# Patient Record
Sex: Male | Born: 1995 | Race: Black or African American | Hispanic: No | Marital: Single | Smoking: Never smoker
Health system: Southern US, Community
[De-identification: ages and names within clinical notes are randomized; demographics above are authoritative.]

## PROBLEM LIST (undated history)

## (undated) DIAGNOSIS — H5350 Unspecified color vision deficiencies: Secondary | ICD-10-CM

## (undated) DIAGNOSIS — J302 Other seasonal allergic rhinitis: Principal | ICD-10-CM

## (undated) DIAGNOSIS — L409 Psoriasis, unspecified: Secondary | ICD-10-CM

## (undated) HISTORY — DX: Psoriasis, unspecified: L40.9

## (undated) HISTORY — DX: Other seasonal allergic rhinitis: J30.2

## (undated) HISTORY — DX: Unspecified color vision deficiencies: H53.50

---
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
## 2017-07-20 ENCOUNTER — Encounter: Payer: Self-pay | Admitting: Family Medicine

## 2017-07-20 ENCOUNTER — Ambulatory Visit (INDEPENDENT_AMBULATORY_CARE_PROVIDER_SITE_OTHER): Payer: BLUE CROSS/BLUE SHIELD | Admitting: Family Medicine

## 2017-07-20 VITALS — BP 153/91 | HR 76 | Temp 97.7°F | Ht 75.0 in | Wt 242.0 lb

## 2017-07-20 DIAGNOSIS — R030 Elevated blood-pressure reading, without diagnosis of hypertension: Secondary | ICD-10-CM | POA: Diagnosis not present

## 2017-07-20 DIAGNOSIS — J302 Other seasonal allergic rhinitis: Secondary | ICD-10-CM | POA: Diagnosis not present

## 2017-07-20 DIAGNOSIS — H1013 Acute atopic conjunctivitis, bilateral: Secondary | ICD-10-CM

## 2017-07-20 DIAGNOSIS — L409 Psoriasis, unspecified: Secondary | ICD-10-CM

## 2017-07-20 HISTORY — DX: Psoriasis, unspecified: L40.9

## 2017-07-20 MED ORDER — FLUTICASONE PROPIONATE 50 MCG/ACT NA SUSP: 2.0000 | Freq: Every day | NASAL | 2 refills | Status: DC

## 2017-07-20 MED ORDER — OLOPATADINE HCL 0.1 % OP SOLN: 1.0000 [drp] | Freq: Two times a day (BID) | OPHTHALMIC | 2 refills | Status: DC

## 2017-07-20 MED ORDER — MONTELUKAST SODIUM 10 MG PO TABS: 10.0000 mg | ORAL_TABLET | Freq: Every day | ORAL | 3 refills | Status: DC

## 2017-07-20 NOTE — Progress Notes (Signed)
Derryck Shahan is a 22 y.o. male who presents to [facility]: Primary Care Sports Medicine today for seasonal allergies.  Alveda Reasons has a history of seasonal allergies.  This is been previously pretty well controlled with levocetirizine.  However he notes the last several weeks significant nasal congestion itchy watery eyes and nasal discharge and sneezing.  He notes the levocetirizine is not been very helpful.  He denies any fevers or chills nausea vomiting or diarrhea.  He notes the symptoms are consistent with prior history of seasonal allergies.  Additionally he notes a history of mildly elevated blood pressure on prior checks.  He notes a family history for hypertension.  He is a Land at [employer].  He does not take any medications for blood pressure.  He denies chest pain palpitations or shortness of breath.   Past Medical History:  Diagnosis Date  . Color blind   . Psoriasis 07/20/2017  . Seasonal allergies 07/27/2015   No past surgical history on file. Social History   Tobacco Use  . Smoking status: Never Smoker  . Smokeless tobacco: Never Used  Substance Use Topics  . Alcohol use: No   family history is not on file.  ROS as above:  Medications: Current Outpatient Medications  Medication Sig Dispense Refill  . clobetasol (TEMOVATE) 0.05 % external solution Apply twice a day as needed for psoriasis involvement in the scalp and ears until smooth    . clobetasol ointment (TEMOVATE) 0.05 % Apply BID to the areas of psoriasis involvement until smooth    . Clobetasol Propionate 0.05 % shampoo Apply topically.    . tretinoin (RETIN-A) 0.025 % cream Apply a pea size to dry face nightly    . fluticasone (FLONASE) 50 MCG/ACT nasal spray Place 2 sprays into both nostrils daily. 16 g 2  . montelukast (SINGULAIR) 10 MG tablet Take 1 tablet (10 mg total) by mouth at bedtime. 90 tablet 3  .  olopatadine (PATANOL) 0.1 % ophthalmic solution Place 1 drop into both eyes 2 (two) times daily. 5 mL 2   No current facility-administered medications for this visit.    No Known Allergies  Health Maintenance Health Maintenance  Topic Date Due  . INFLUENZA VACCINE  10/12/2017  . TETANUS/TDAP  12/04/2018  . HIV Screening  Completed     Exam:  BP (!) 153/91   Pulse 76   Temp 97.7 F (36.5 C) (Oral)   Ht  (1.905 m)   Wt 242 lb (109.8 kg)   BMI 30.25 kg/m  Gen: Well NAD HEENT: EOMI,  MMM clear nasal discharge.  Inflamed nasal turbinates bilaterally.  Mild conjunctival injection bilaterally.  Normal tympanic membranes.  No cervical lymphadenopathy. Lungs: Normal work of breathing. CTABL Heart: RRR no MRG Abd: NABS, Soft. Nondistended, Nontender Exts: Brisk capillary refill, warm and well perfused.  Skin: Scaly circular macular lesions on scalp some hypopigmented.    Assessment and Plan: 22 y.o. male with seasonal allergies.  Allergic rhinitis and conjunctivitis.  Treat with oral nonsedating antihistamine.  Recommend cetirizine.  We will add Singulair and Flonase nasal spray.  Additionally use Patanol eyedrops.  Skin lesions are psoriasis which is currently well managed with medications updated today.    Follow-up with PCP in a few weeks for hypertension evaluation and management.  Recheck as needed.   No orders of the defined types were placed in this encounter.  Meds ordered this encounter  Medications  . olopatadine (  PATANOL) 0.1 % ophthalmic solution    Sig: Place 1 drop into both eyes 2 (two) times daily.    Dispense:  5 mL    Refill:  2  . montelukast (SINGULAIR) 10 MG tablet    Sig: Take 1 tablet (10 mg total) by mouth at bedtime.    Dispense:  90 tablet    Refill:  3  . fluticasone (FLONASE) 50 MCG/ACT nasal spray    Sig: Place 2 sprays into both nostrils daily.    Dispense:  16 g    Refill:  2     Discussed warning signs or symptoms. Please see  discharge instructions. Patient expresses understanding.

## 2017-07-20 NOTE — Patient Instructions (Addendum)
Thank you for coming in today. Take zyrtec or claratin or allegra daily or twice daily.  Use the flonase nasal spray for at least 1 month.  Start singulair daily for allergies.  Use the eye drops as needed.  If not better let me know.  We can get your to an allergies  Blood pressure keep a log at school.  If persistently elevated let us know.  .  I recommend follow up with Dr T before you go back to school.   Allergic Rhinitis, Adult Allergic rhinitis is an allergic reaction that affects the mucous membrane inside the nose. It causes sneezing, a runny or stuffy nose, and the feeling of mucus going down the back of the throat (postnasal drip). Allergic rhinitis can be mild to severe. There are two types of allergic rhinitis:  Seasonal. This type is also called hay fever. It happens only during certain seasons.  Perennial. This type can happen at any time of the year.  What are the causes? This condition happens when the body's defense system (immune system) responds to certain harmless substances called allergens as though they were germs.  Seasonal allergic rhinitis is triggered by pollen, which can come from grasses, trees, and weeds. Perennial allergic rhinitis may be caused by:  House dust mites.  Pet dander.  Mold spores.  What are the signs or symptoms? Symptoms of this condition include:  Sneezing.  Runny or stuffy nose (nasal congestion).  Postnasal drip.  Itchy nose.  Tearing of the eyes.  Trouble sleeping.  Daytime sleepiness.  How is this diagnosed? This condition may be diagnosed based on:  Your medical history.  A physical exam.  Tests to check for related conditions, such as: ? Asthma. ? Pink eye. ? Ear infection. ? Upper respiratory infection.  Tests to find out which allergens trigger your symptoms. These may include skin or blood tests.  How is this treated? There is no cure for this condition, but treatment can help control symptoms.  Treatment may include:  Taking medicines that block allergy symptoms, such as antihistamines. Medicine may be given as a shot, nasal spray, or pill.  Avoiding the allergen.  Desensitization. This treatment involves getting ongoing shots until your body becomes less sensitive to the allergen. This treatment may be done if other treatments do not help.  If taking medicine and avoiding the allergen does not work, new, stronger medicines may be prescribed.  Follow these instructions at home:  Find out what you are allergic to. Common allergens include smoke, dust, and pollen.  Avoid the things you are allergic to. These are some things you can do to help avoid allergens: ? Replace carpet with wood, tile, or vinyl flooring. Carpet can trap dander and dust. ? Do not smoke. Do not allow smoking in your home. ? Change your heating and air conditioning filter at least once a month. ? During allergy season:  Keep windows closed as much as possible.  Plan outdoor activities when pollen counts are lowest. This is usually during the evening hours.  When coming indoors, change clothing and shower before sitting on furniture or bedding.  Take over-the-counter and prescription medicines only as told by your health care provider.  Keep all follow-up visits as told by your health care provider. This is important. Contact a health care provider if:  You have a fever.  You develop a persistent cough.  You make whistling sounds when you breathe (you wheeze).  Your symptoms interfere with your normal  daily activities. Get help right away if:  You have shortness of breath. Summary  This condition can be managed by taking medicines as directed and avoiding allergens.  Contact your health care provider if you develop a persistent cough or fever.  During allergy season, keep windows closed as much as possible. This information is not intended to replace advice given to you by your health care  provider. Make sure you discuss any questions you have with your health care provider. Document Released: 11/23/2000 Document Revised: 04/07/2016 Document Reviewed: 04/07/2016 Elsevier Interactive Patient Education  [employer].

## 2017-09-18 IMAGING — CR DG CHEST 2V
2 series · 2 of 2 positions shown · non-contrast
Comparison: No prior

CLINICAL DATA: Cough for 3 days.

EXAM:
CHEST  2 VIEW

[chest pa]
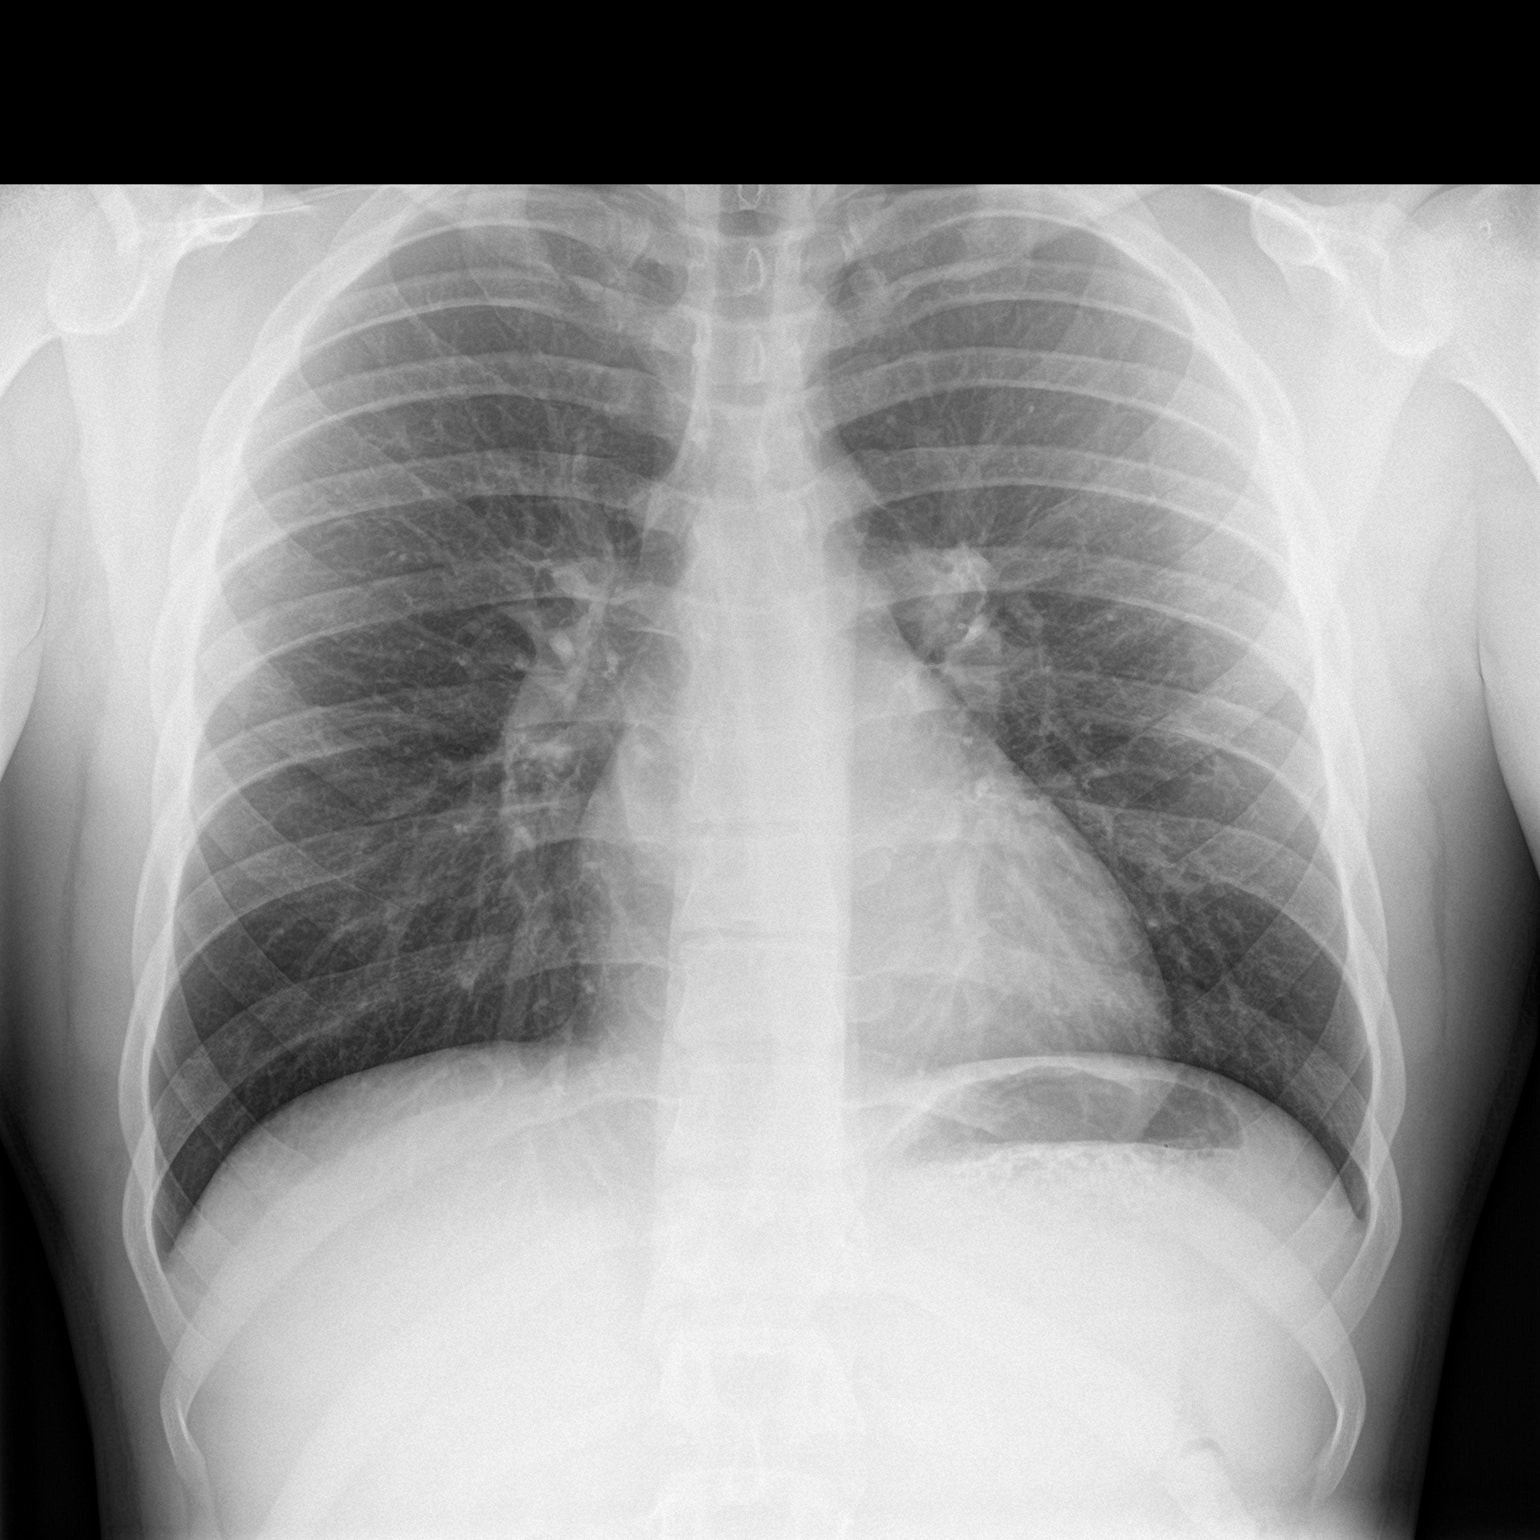

[chest lat]
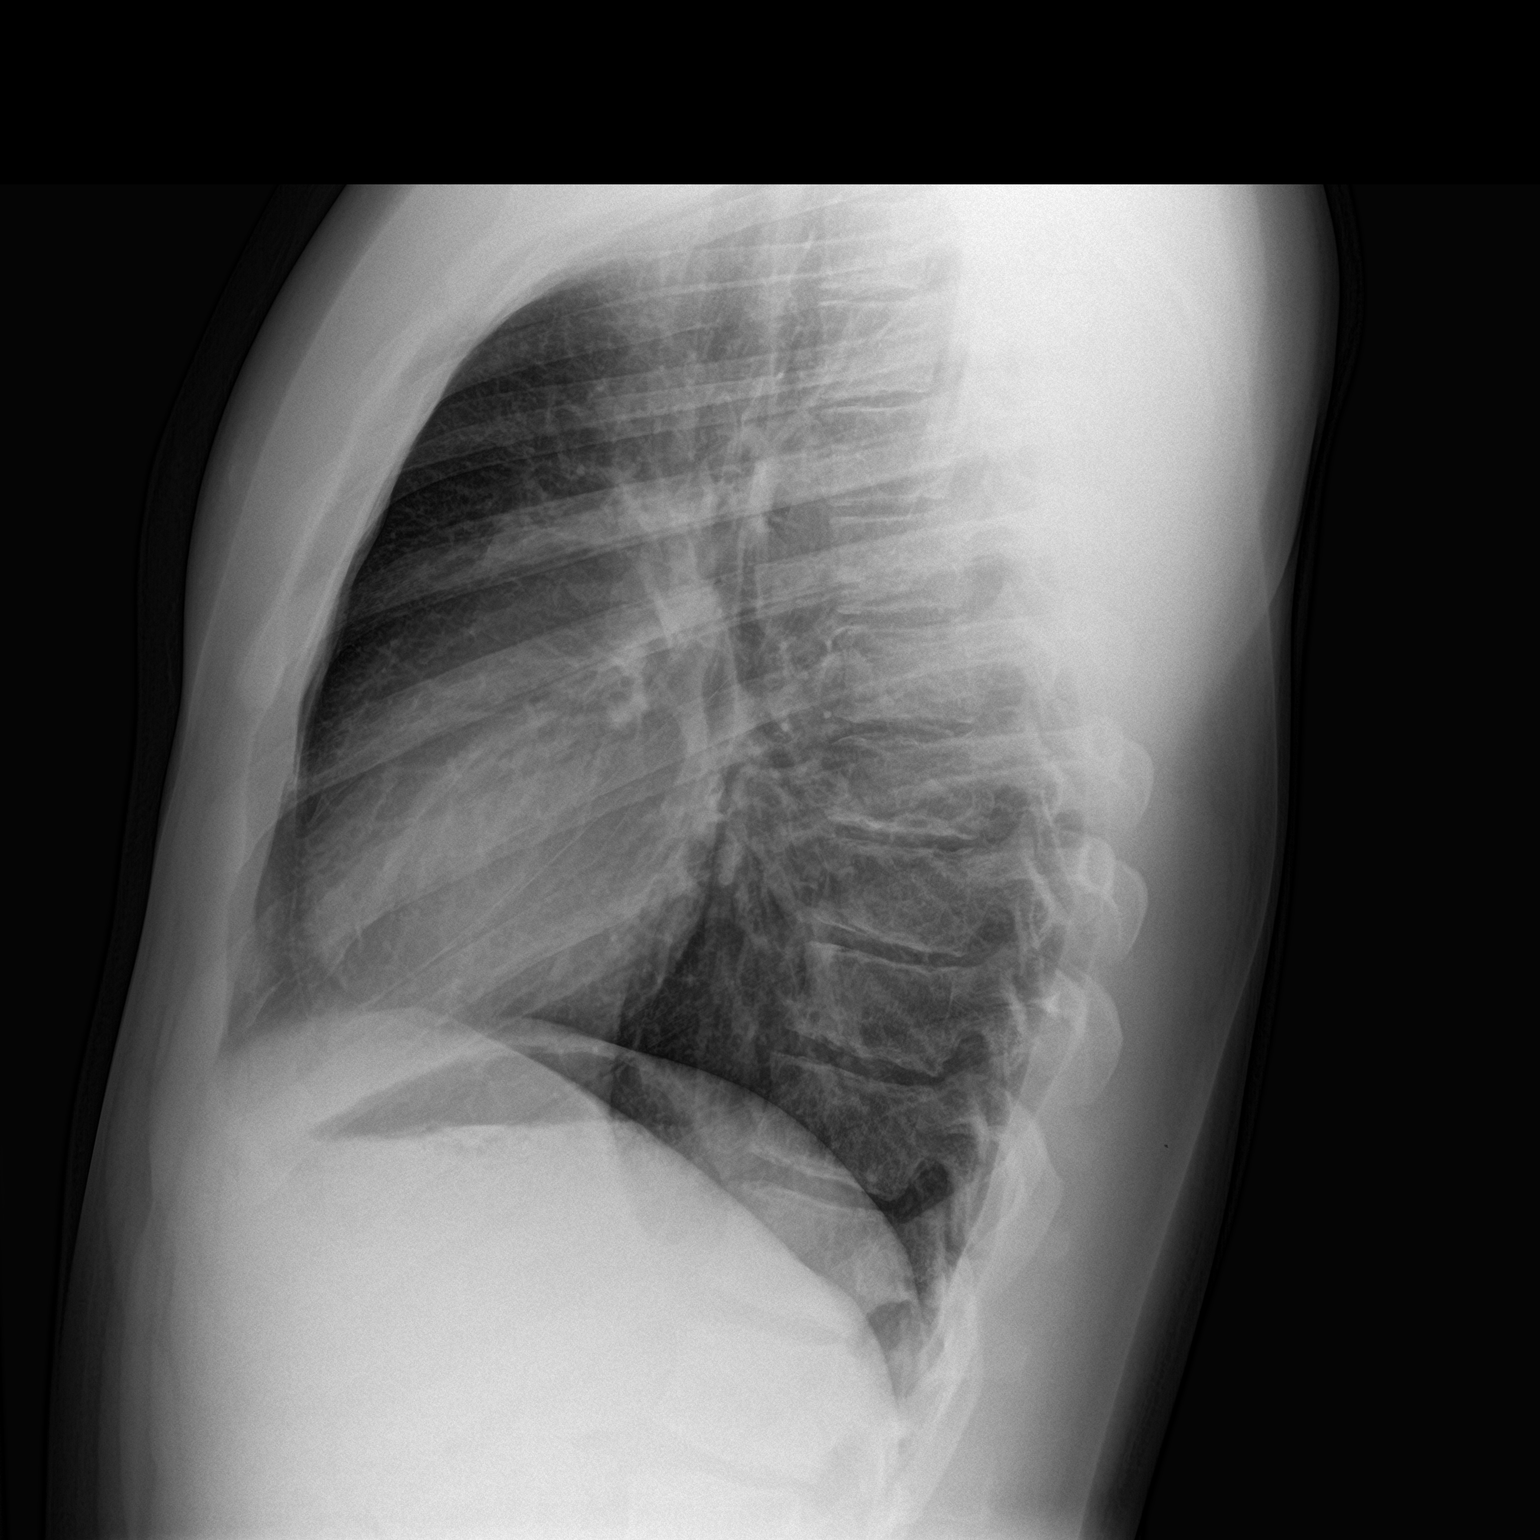

[2 of 2 positions shown; findings below may reference images not displayed]

FINDINGS: Mediastinum and hilar structures normal. Lungs are clear. Heart size
normal. No pleural effusion or pneumothorax . Mild pectus deformity.
IMPRESSION: No acute cardiopulmonary disease.

## 2018-07-17 ENCOUNTER — Other Ambulatory Visit: Payer: Self-pay | Admitting: Sports Medicine

## 2018-08-08 ENCOUNTER — Ambulatory Visit (INDEPENDENT_AMBULATORY_CARE_PROVIDER_SITE_OTHER): Payer: BC Managed Care – PPO | Admitting: Sports Medicine

## 2018-08-08 ENCOUNTER — Encounter: Payer: Self-pay | Admitting: Sports Medicine

## 2018-08-08 DIAGNOSIS — R030 Elevated blood-pressure reading, without diagnosis of hypertension: Secondary | ICD-10-CM

## 2018-08-08 DIAGNOSIS — N489 Disorder of penis, unspecified: Secondary | ICD-10-CM | POA: Diagnosis not present

## 2018-08-08 DIAGNOSIS — Z113 Encounter for screening for infections with a predominantly sexual mode of transmission: Secondary | ICD-10-CM | POA: Diagnosis not present

## 2018-08-08 NOTE — Assessment & Plan Note (Signed)
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 I offered cryotherapy, he will think about it. At some point at a future visit we do need to discuss HPV vaccination.

## 2018-08-08 NOTE — Progress Notes (Signed)
Subjective:    CC: Reestablish care  HPI:  This is a pleasant 23 year old male, he is here to discuss STD screening.  He has no symptoms with the exception of a few bumps on the shaft of his penis.  No dysuria, no definite exposures to STDs.  In addition he is due for some routine screening labs.  I have not seen him in some time.  I reviewed the past medical history, family history, social history, surgical history, and allergies today and no changes were needed.  Please see the problem list section below in epic for further details.  Past Medical History: Past Medical History:  Diagnosis Date  . Color blind   . Psoriasis 07/20/2017  . Seasonal allergies 07/27/2015   Past Surgical History: No past surgical history on file. Social History: Social History   Socioeconomic History  . Marital status: Single    Spouse name: Not on file  . Number of children: Not on file  . Years of education: Not on file  . Highest education level: Not on file  Occupational History  . Not on file  Social Needs  . Financial resource strain: Not on file  . Food insecurity:    Worry: Not on file    Inability: Not on file  . Transportation needs:    Medical: Not on file    Non-medical: Not on file  Tobacco Use  . Smoking status: Never Smoker  . Smokeless tobacco: Never Used  Substance and Sexual Activity  . Alcohol use: No  . Drug use: No  . Sexual activity: Never  Lifestyle  . Physical activity:    Days per week: Not on file    Minutes per session: Not on file  . Stress: Not on file  Relationships  . Social connections:    Talks on phone: Not on file    Gets together: Not on file    Attends religious service: Not on file    Active member of club or organization: Not on file    Attends meetings of clubs or organizations: Not on file    Relationship status: Not on file  Other Topics Concern  . Not on file  Social History Narrative  . Not on file   Family History: Family History   Problem Relation Age of Onset  . Hypertension Father   . Diabetes Paternal Grandmother    Allergies: No Known Allergies Medications: See med rec.  Review of Systems: No headache, visual changes, nausea, vomiting, diarrhea, constipation, dizziness, abdominal pain, skin rash, fevers, chills, night sweats, swollen lymph nodes, weight loss, chest pain, body aches, joint swelling, muscle aches, shortness of breath, mood changes, visual or auditory hallucinations.  Objective:    General: Well Developed, well nourished, and in no acute distress.  Neuro: Alert and oriented x3, extra-ocular muscles intact, sensation grossly intact.  HEENT: Normocephalic, atraumatic, pupils equal round reactive to light, neck supple, no masses, no lymphadenopathy, thyroid nonpalpable.  Skin: Warm and dry, no rashes noted.  Cardiac: Regular rate and rhythm, no murmurs rubs or gallops.  Respiratory: Clear to auscultation bilaterally. Not using accessory muscles, speaking in full sentences.  Abdominal: Soft, nontender, nondistended, positive bowel sounds, no masses, no organomegaly.  Musculoskeletal: Shoulder, elbow, wrist, hip, knee, ankle stable, and with full range of motion. Genitourinary: There are a few papules on the shaft of the penis that resemble genital warts, no discharge, no other abnormalities visible.  Impression and Recommendations:    The patient was counselled,  risk factors were discussed, anticipatory guidance given.  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 I offered cryotherapy, he will think about it. At some point at a future visit we do need to discuss HPV vaccination.  Screening examination for STD (sexually transmitted disease) Doing the full STD screens.   ___________________________________________ Ihor Austinhomas J. Benjamin Stainhekkekandam, M.D., ABFM., CAQSM. Primary Care and Sports Medicine [facility]  Adjunct Professor of Family Medicine  [employer]

## 2018-08-08 NOTE — Assessment & Plan Note (Signed)
Doing the full STD screens.

## 2018-08-09 LAB — LIPID PANEL W/REFLEX DIRECT LDL
Cholesterol: 141 mg/dL (ref <200)
HDL: 50 mg/dL (ref >=40)
LDL Cholesterol (Calc): 78 mg/dL (calc)
Non-HDL Cholesterol (Calc): 91 mg/dL (calc) (ref <130)
Total CHOL/HDL Ratio: 2.8 (calc) (ref <5.0)
Triglycerides: 45 mg/dL (ref <150)

## 2018-08-09 LAB — COMPLETE METABOLIC PANEL WITH GFR
AG Ratio: 1.6 (calc) (ref 1.0–2.5)
ALT: 13 U/L (ref 9–46)
AST: 14 U/L (ref 10–40)
Albumin: 4.8 g/dL (ref 3.6–5.1)
Alkaline phosphatase (APISO): 70 U/L (ref 36–130)
BUN: 16 mg/dL (ref 7–25)
CO2: 29 mmol/L (ref 20–32)
Calcium: 10.2 mg/dL (ref 8.6–10.3)
Chloride: 100 mmol/L (ref 98–110)
Creat: 1.01 mg/dL (ref 0.60–1.35)
GFR, Est African American: 123 mL/min/{1.73_m2} (ref >=60)
GFR, Est Non African American: 106 mL/min/{1.73_m2} (ref >=60)
Globulin: 3 g/dL (calc) (ref 1.9–3.7)
Glucose, Bld: 94 mg/dL (ref 65–99)
Potassium: 4.8 mmol/L (ref 3.5–5.3)
Sodium: 138 mmol/L (ref 135–146)
Total Bilirubin: 0.5 mg/dL (ref 0.2–1.2)
Total Protein: 7.8 g/dL (ref 6.1–8.1)

## 2018-08-09 LAB — CBC
HCT: 50.4 % — ABNORMAL HIGH (ref 38.5–50.0)
Hemoglobin: 17.4 g/dL — ABNORMAL HIGH (ref 13.2–17.1)
MCH: 31.9 pg (ref 27.0–33.0)
MCHC: 34.5 g/dL (ref 32.0–36.0)
MCV: 92.5 fL (ref 80.0–100.0)
MPV: 10.5 fL (ref 7.5–12.5)
Platelets: 230 10*3/uL (ref 140–400)
RBC: 5.45 10*6/uL (ref 4.20–5.80)
RDW: 11.7 % (ref 11.0–15.0)
WBC: 6 10*3/uL (ref 3.8–10.8)

## 2018-08-09 LAB — TSH: TSH: 0.74 mIU/L (ref 0.40–4.50)

## 2018-08-09 LAB — HEPATITIS PANEL, ACUTE
Hep A IgM: NONREACTIVE
Hep B C IgM: NONREACTIVE
Hepatitis B Surface Ag: NONREACTIVE
Hepatitis C Ab: NONREACTIVE
SIGNAL TO CUT-OFF: 0.04 (ref <1.00)

## 2018-08-09 LAB — C. TRACHOMATIS/N. GONORRHOEAE RNA
C. trachomatis RNA, TMA: NOT DETECTED
N. gonorrhoeae RNA, TMA: NOT DETECTED

## 2018-08-09 LAB — HEMOGLOBIN A1C
Hgb A1c MFr Bld: 5 % of total Hgb (ref <5.7)
Mean Plasma Glucose: 97 (calc)
eAG (mmol/L): 5.4 (calc)

## 2018-08-09 LAB — HIV ANTIBODY (ROUTINE TESTING W REFLEX): HIV 1&2 Ab, 4th Generation: NONREACTIVE

## 2018-08-09 LAB — RPR: RPR Ser Ql: NONREACTIVE

## 2018-08-09 LAB — VITAMIN D 25 HYDROXY (VIT D DEFICIENCY, FRACTURES): Vit D, 25-Hydroxy: 32 ng/mL (ref 30–100)

## 2018-08-09 LAB — HSV 2 ANTIBODY, IGG: HSV 2 Glycoprotein G Ab, IgG: <0.9 index

## 2019-03-29 ENCOUNTER — Encounter: Payer: BC Managed Care – PPO | Admitting: Sports Medicine

## 2019-04-01 ENCOUNTER — Ambulatory Visit (INDEPENDENT_AMBULATORY_CARE_PROVIDER_SITE_OTHER): Payer: BC Managed Care – PPO | Admitting: Sports Medicine

## 2019-04-01 ENCOUNTER — Other Ambulatory Visit: Payer: Self-pay

## 2019-04-01 ENCOUNTER — Encounter: Payer: Self-pay | Admitting: Sports Medicine

## 2019-04-01 DIAGNOSIS — L409 Psoriasis, unspecified: Secondary | ICD-10-CM

## 2019-04-01 DIAGNOSIS — H6123 Impacted cerumen, bilateral: Secondary | ICD-10-CM | POA: Diagnosis not present

## 2019-04-01 DIAGNOSIS — Z0000 Encounter for general adult medical examination without abnormal findings: Secondary | ICD-10-CM | POA: Insufficient documentation

## 2019-04-01 DIAGNOSIS — Z025 Encounter for examination for participation in sport: Secondary | ICD-10-CM

## 2019-04-01 MED ORDER — CALCIPOTRIENE 0.005 % EX CREA: TOPICAL_CREAM | Freq: Two times a day (BID) | CUTANEOUS | 3 refills | Status: DC

## 2019-04-01 NOTE — Progress Notes (Addendum)
Subjective:    CC: Annual Physical Exam  HPI:  Billy Campos is here for his sports physical.  I reviewed the past medical history, family history, social history, surgical history, and allergies today and no changes were needed.  Please see the problem list section below in epic for further details.  Past Medical History: Past Medical History:  Diagnosis Date  . Color blind   . Psoriasis 07/20/2017  . Seasonal allergies 07/27/2015   Past Surgical History: No past surgical history on file. Social History: Social History   Socioeconomic History  . Marital status: Single    Spouse name: Not on file  . Number of children: Not on file  . Years of education: Not on file  . Highest education level: Not on file  Occupational History  . Not on file  Tobacco Use  . Smoking status: Never Smoker  . Smokeless tobacco: Never Used  Substance and Sexual Activity  . Alcohol use: No  . Drug use: No  . Sexual activity: Never  Other Topics Concern  . Not on file  Social History Narrative  . Not on file   Social Determinants of Health   Financial Resource Strain:   . Difficulty of Paying Living Expenses: Not on file  Food Insecurity:   . Worried About Programme researcher, broadcasting/film/video in the Last Year: Not on file  . Ran Out of Food in the Last Year: Not on file  Transportation Needs:   . Lack of Transportation (Medical): Not on file  . Lack of Transportation (Non-Medical): Not on file  Physical Activity:   . Days of Exercise per Week: Not on file  . Minutes of Exercise per Session: Not on file  Stress:   . Feeling of Stress : Not on file  Social Connections:   . Frequency of Communication with Friends and Family: Not on file  . Frequency of Social Gatherings with Friends and Family: Not on file  . Attends Religious Services: Not on file  . Active Member of Clubs or Organizations: Not on file  . Attends Banker Meetings: Not on file  . Marital Status: Not on file   Family  History: Family History  Problem Relation Age of Onset  . Hypertension Father   . Diabetes Paternal Grandmother    Allergies: No Known Allergies Medications: See med rec.  Review of Systems: No headache, visual changes, nausea, vomiting, diarrhea, constipation, dizziness, abdominal pain, skin rash, fevers, chills, night sweats, swollen lymph nodes, weight loss, chest pain, body aches, joint swelling, muscle aches, shortness of breath, mood changes, visual or auditory hallucinations.  Objective:    General: Well Developed, well nourished, and in no acute distress.  Neuro: Alert and oriented x3, extra-ocular muscles intact, sensation grossly intact. Cranial nerves II through XII are intact, motor, sensory, and coordinative functions are all intact. HEENT: Normocephalic, atraumatic, pupils equal round reactive to light, neck supple, no masses, no lymphadenopathy, thyroid nonpalpable. Oropharynx, nasopharynx, external ear canals are unremarkable with the exception of bilateral cerumen impactions. Skin: Warm and dry, psoriatic plaques on the back of the head and right knee. Cardiac: Regular rate and rhythm, no murmurs rubs or gallops.  Respiratory: Clear to auscultation bilaterally. Not using accessory muscles, speaking in full sentences.  Abdominal: Soft, nontender, nondistended, positive bowel sounds, no masses, no organomegaly.  Musculoskeletal: Shoulder, elbow, wrist, hip, knee, ankle stable, and with full range of motion.  Indication: Cerumen impaction of the left and right ear(s) Medical necessity statement: On  physical examination, cerumen impairs clinically significant portions of the external auditory canal, and tympanic membrane. Noted obstructive, copious cerumen that cannot be removed without magnification and instrumentations requiring physician skills Consent: Discussed benefits and risks of procedure and verbal consent obtained Procedure: Patient was prepped for the procedure.  Utilized an otoscope to assess and take note of the ear canal, the tympanic membrane, and the presence, amount, and placement of the cerumen. Gentle water irrigation as well as curettage with a soft plastic curette was utilized to remove copious cerumen in both canals.  Post procedure examination: shows cerumen was completely removed. Patient tolerated procedure well. The patient is made aware that they may experience temporary vertigo, temporary hearing loss, and temporary discomfort. If these symptom last for more than 24 hours to call the clinic or proceed to the ED.  Impression and Recommendations:    The patient was counselled, risk factors were discussed, anticipatory guidance given.  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 If they require an ECG we are happy to do that as well. I am also going to go ahead and get his routine labs. He is cleared for full participation in [employer] football. We will fax his sports physical as well as his labs to [phone]  Psoriasis Billy Campos does have a couple of psoriatic plaques on the back of his head and over his left knee. I am going to start with topical calcipotriene, if this is too expensive we will do a simple topical clobetasol, but historically these have not been as effective for psoriasis. He has no signs or symptoms of psoriatic arthritis. If this is ineffective he will need to start systemic biologic treatment.  Calcipotriene is not covered by his insurance plan, switching to topical clobetasol. We can certainly try topical coal tar if needed.   ___________________________________________ Gwen Her. Dianah Field, M.D., ABFM., CAQSM. Primary Care and Sports Medicine [facility]  Adjunct Professor of [place] [employer]

## 2019-04-01 NOTE — Assessment & Plan Note (Addendum)
Billy Campos does have a couple of psoriatic plaques on the back of his head and over his left knee. I am going to start with topical calcipotriene, if this is too expensive we will do a simple topical clobetasol, but historically these have not been as effective for psoriasis. He has no signs or symptoms of psoriatic arthritis. If this is ineffective he will need to start systemic biologic treatment.  Calcipotriene is not covered by his insurance plan, switching to topical clobetasol. We can certainly try topical coal tar if needed.

## 2019-04-01 NOTE — Assessment & Plan Note (Addendum)
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 If they require an ECG we are happy to do that as well. I am also going to go ahead and get his routine labs. He is cleared for full participation in [employer] football. We will fax his sports physical as well as his labs to [phone]

## 2019-04-02 MED ORDER — CLOBETASOL PROPIONATE 0.05 % EX CREA: 1.0000 "application " | TOPICAL_CREAM | Freq: Two times a day (BID) | CUTANEOUS | 2 refills | Status: DC

## 2019-04-02 NOTE — Addendum Note (Signed)
Addended by: Monica Becton on: 04/02/2019 09:52 AM   Modules accepted: Orders

## 2020-06-08 ENCOUNTER — Other Ambulatory Visit: Payer: Self-pay

## 2020-06-08 ENCOUNTER — Emergency Department
Admission: EM | Admit: 2020-06-08 | Discharge: 2020-06-08 | Disposition: A | Discharge status: Home / Self Care | Payer: BC Managed Care – PPO | Source: Home / Self Care

## 2020-06-08 DIAGNOSIS — Z1152 Encounter for screening for COVID-19: Secondary | ICD-10-CM

## 2020-06-08 DIAGNOSIS — J0140 Acute pansinusitis, unspecified: Secondary | ICD-10-CM | POA: Diagnosis not present

## 2020-06-08 MED ORDER — AMOXICILLIN-POT CLAVULANATE 875-125 MG PO TABS: 1.0000 | ORAL_TABLET | Freq: Two times a day (BID) | ORAL | 0 refills | Status: AC

## 2020-06-08 NOTE — Discharge Instructions (Addendum)
I have sent in Augmentin for you to take twice a day for 7 days.  I would have you use zyrtec and flonase for symptomatic relief  Your COVID and flu test is pending.  You should self quarantine until the test result is back.    Take Tylenol or ibuprofen as needed for fever or discomfort.  Rest and keep yourself hydrated.    Follow-up with your primary care provider if your symptoms are not improving.

## 2020-06-08 NOTE — ED Triage Notes (Signed)
Patient presents to Urgent Care with complaints of fever, runny nose, "heat hives". Pt does have seasonal allergies. Not vaccinated for covid but has hx of covid.

## 2020-06-08 NOTE — ED Provider Notes (Signed)
[facility]   [id number] 06/08/20 Arrival Time: 1305  PP:JKDT THROAT  SUBJECTIVE: History from: patient.  Billy Campos is a 25 y.o. male who presents with abrupt onset of nasal congestion, headache, chills, sinus pain, ear fullness, fatigue for the last 3 days. Reports that he has been battling seasonal allergies for about a week prior to this. Reports that he has been taking Mucinex and benadryl for his symptoms with little relief. Denies sick exposure to Covid, strep, flu or mono, or precipitating event. Has negative history of Covid. Has not had Covid vaccines. Has not completed flu vaccine this season. Symptoms are worse with being outside and with activity. Denies previous symptoms in the past.     Denies fever, ear pain, rhinorrhea, cough, SOB, wheezing, chest pain, nausea, rash, changes in bowel or bladder habits.    ROS: As per HPI.  All other pertinent ROS negative.     Past Medical History:  Diagnosis Date  . Color blind   . Psoriasis 07/20/2017  . Seasonal allergies 07/27/2015   History reviewed. No pertinent surgical history. No Known Allergies No current facility-administered medications on file prior to encounter.   Current Outpatient Medications on File Prior to Encounter  Medication Sig Dispense Refill  . clobetasol cream (TEMOVATE) 0.05 % Apply 1 application topically 2 (two) times daily. 60 g 2   Social History   Socioeconomic History  . Marital status: Single    Spouse name: Not on file  . Number of children: Not on file  . Years of education: Not on file  . Highest education level: Not on file  Occupational History  . Not on file  Tobacco Use  . Smoking status: Never Smoker  . Smokeless tobacco: Never Used  Substance and Sexual Activity  . Alcohol use: No  . Drug use: Yes    Frequency: 7.0 times per week    Types: Marijuana  . Sexual activity: Never  Other Topics Concern  . Not on file  Social History Narrative  . Not on file    Social Determinants of Health   Financial Resource Strain: Not on file  Food Insecurity: Not on file  Transportation Needs: Not on file  Physical Activity: Not on file  Stress: Not on file  Social Connections: Not on file  Intimate Partner Violence: Not on file   Family History  Problem Relation Age of Onset  . Hypertension Father   . Diabetes Paternal Grandmother     OBJECTIVE:  Vitals:   06/08/20 1321  BP: 131/82  Pulse: 91  Resp: 14  Temp: 99 F (37.2 C)  TempSrc: Oral  SpO2: 98%  Weight: 230 lb (104.3 kg)  Height: 6\' 3"  (1.905 m)     General appearance: alert; appears fatigued, but nontoxic, speaking in full sentences and managing own secretions HEENT: NCAT; Ears: EACs clear, TMs pearly gray with visible cone of light, without erythema; Eyes: PERRL, EOMI grossly; Nose: clear rhinorrhea; Throat: oropharynx clear, tonsils 1+ and mildly erythematous without white tonsillar exudates, uvula midline; Sinuses: frontal and maxillary sinuses tender to palpation Neck: supple with LAD Lungs: CTA bilaterally without adventitious breath sounds; cough absent Heart: regular rate and rhythm.  Radial pulses 2+ symmetrical bilaterally Skin: warm and dry Psychological: alert and cooperative; normal mood and affect  LABS: No results found for this or any previous visit (from the past 24 hour(s)).   ASSESSMENT & PLAN:  1. Acute non-recurrent pansinusitis   2. Encounter for screening for COVID-19  Meds ordered this encounter  Medications  . amoxicillin-clavulanate (AUGMENTIN) 875-125 MG tablet    Sig: Take 1 tablet by mouth 2 (two) times daily for 10 days.    Dispense:  20 tablet    Refill:  0    Order Specific Question:   Supervising Provider    Answer:   Merrilee Jansky [id number]    Acute Sinusitis Push fluids and get rest Prescribed amoxicillin 875mg  twice daily for 10 days.   Take as directed and to completion.  Drink warm or cool liquids, use throat lozenges,  or popsicles to help alleviate symptoms Take OTC ibuprofen or tylenol as needed for pain May use Zyrtec D and flonase to help alleviate symptoms Follow up with PCP if symptoms persist Return or go to ER if you have any new or worsening symptoms such as fever, chills, nausea, vomiting, worsening sore throat, cough, abdominal pain, chest pain, changes in bowel or bladder habits. Work note provided Covid swab obtained in office today.   Patient instructed to quarantine until results are back and negative.   If results are negative, patient may resume daily schedule as tolerated once they are fever free for 24 hours without the use of antipyretic medications.   If results are positive, patient instructed to quarantine for at least 5 days from symptom onset.  If after 5 days symptoms have resolved, may return to work with a well fitting mask for the next 5 days. If symptomatic after day 5, isolation should be extended to 10 days. Patient instructed to follow-up with primary care or with this office as needed.   Patient instructed to follow-up in the ER for trouble swallowing, trouble breathing, other concerning symptoms.         , NP 06/08/20 [phone]

## 2020-06-10 LAB — COVID-19, FLU A+B NAA
Influenza A, NAA: NOT DETECTED
Influenza B, NAA: NOT DETECTED
SARS-CoV-2, NAA: NOT DETECTED

## 2020-08-05 ENCOUNTER — Other Ambulatory Visit: Payer: Self-pay

## 2020-08-05 ENCOUNTER — Ambulatory Visit (INDEPENDENT_AMBULATORY_CARE_PROVIDER_SITE_OTHER): Payer: BC Managed Care – PPO | Admitting: Sports Medicine

## 2020-08-05 ENCOUNTER — Encounter: Payer: Self-pay | Admitting: Sports Medicine

## 2020-08-05 VITALS — BP 125/77 | HR 72 | Ht 75.0 in | Wt 233.0 lb

## 2020-08-05 DIAGNOSIS — Z0000 Encounter for general adult medical examination without abnormal findings: Secondary | ICD-10-CM | POA: Diagnosis not present

## 2020-08-05 NOTE — Assessment & Plan Note (Signed)
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 We are going to get his routine labs, he did have his Tdap October 13, 2019, he also had his TB screen last week. He is up-to-date on other screenings, he can return to see me in a year.

## 2020-08-05 NOTE — Progress Notes (Signed)
  Subjective:    CC: Annual Physical Exam  HPI:  This patient is here for their annual physical  I reviewed the past medical history, family history, social history, surgical history, and allergies today and no changes were needed.  Please see the problem list section below in epic for further details.  Past Medical History: Past Medical History:  Diagnosis Date  . Color blind   . Psoriasis 07/20/2017  . Seasonal allergies 07/27/2015   Past Surgical History: No past surgical history on file. Social History: Social History   Socioeconomic History  . Marital status: Single    Spouse name: Not on file  . Number of children: Not on file  . Years of education: Not on file  . Highest education level: Not on file  Occupational History  . Not on file  Tobacco Use  . Smoking status: Never Smoker  . Smokeless tobacco: Never Used  Substance and Sexual Activity  . Alcohol use: No  . Drug use: Yes    Frequency: 7.0 times per week    Types: Marijuana  . Sexual activity: Never  Other Topics Concern  . Not on file  Social History Narrative  . Not on file   Social Determinants of Health   Financial Resource Strain: Not on file  Food Insecurity: Not on file  Transportation Needs: Not on file  Physical Activity: Not on file  Stress: Not on file  Social Connections: Not on file   Family History: Family History  Problem Relation Age of Onset  . Hypertension Father   . Diabetes Paternal Grandmother    Allergies: No Known Allergies Medications: See med rec.  Review of Systems: No headache, visual changes, nausea, vomiting, diarrhea, constipation, dizziness, abdominal pain, skin rash, fevers, chills, night sweats, swollen lymph nodes, weight loss, chest pain, body aches, joint swelling, muscle aches, shortness of breath, mood changes, visual or auditory hallucinations.  Objective:    General: Well Developed, well nourished, and in no acute distress.  Neuro: Alert and oriented  x3, extra-ocular muscles intact, sensation grossly intact. Cranial nerves II through XII are intact, motor, sensory, and coordinative functions are all intact. HEENT: Normocephalic, atraumatic, pupils equal round reactive to light, neck supple, no masses, no lymphadenopathy, thyroid nonpalpable. Oropharynx, nasopharynx, external ear canals are unremarkable. Skin: Warm and dry, no rashes noted.  Mild psoriatic plaques noted around his ears and scalp. Cardiac: Regular rate and rhythm, no murmurs rubs or gallops.  Respiratory: Clear to auscultation bilaterally. Not using accessory muscles, speaking in full sentences.  Abdominal: Soft, nontender, nondistended, positive bowel sounds, no masses, no organomegaly.  Musculoskeletal: Shoulder, elbow, wrist, hip, knee, ankle stable, and with full range of motion.  Impression and Recommendations:    The patient was counselled, risk factors were discussed, anticipatory guidance given.  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 We are going to get his routine labs, he did have his Tdap October 13, 2019, he also had his TB screen last week. He is up-to-date on other screenings, he can return to see me in a year.   ___________________________________________ Ihor Austin. Benjamin Stain, M.D., ABFM., CAQSM. Primary Care and Sports Medicine [facility]  Adjunct Professor of Family Medicine  [employer]

## 2020-09-22 ENCOUNTER — Encounter: Payer: Self-pay | Admitting: Podiatry

## 2020-09-22 ENCOUNTER — Other Ambulatory Visit: Payer: Self-pay

## 2020-09-22 ENCOUNTER — Ambulatory Visit: Payer: BC Managed Care – PPO | Admitting: Podiatry

## 2020-09-22 DIAGNOSIS — L601 Onycholysis: Secondary | ICD-10-CM

## 2020-09-22 DIAGNOSIS — L603 Nail dystrophy: Secondary | ICD-10-CM

## 2020-09-22 NOTE — Patient Instructions (Signed)
I have ordered a medication for you that will come from [facility] in [place]. They should be calling you to verify insurance and will mail the medication to you. If you live close by then you can go by their pharmacy to pick up the medication. Their phone number is [phone]. If you do not hear from them in the next few days, please give us a call at [phone].   

## 2020-09-28 NOTE — Progress Notes (Signed)
Subjective:   Patient ID: Billy Campos, male   DOB: 25 y.o.   MRN: [id number]   HPI 25 year old male presents the office with concerns of thick, discolored toenails that he cannot trim himself he states been ongoing as entire life.  He states he does not feel to cut his toe nails.  He states it does hurt occasionally in certain shoes because denies any pain.  He said no recent treatment. Redness and drainage that he reports.   Review of Systems  All other systems reviewed and are negative.  Past Medical History:  Diagnosis Date   Color blind    Psoriasis 07/20/2017   Seasonal allergies 07/27/2015    History reviewed. No pertinent surgical history.   Current Outpatient Medications:    NON FORMULARY, [facility] Antifungal (nail)-#1  Dr Janace Hoard, Disp: , Rfl:   No Known Allergies        Objective:  Physical Exam  General: AAO x3, NAD  Dermatological: Nails are hypertrophic, dystrophic with yellow-brown discoloration with subungual debris.  No pain in the nails that have not returned any signs of infection.  There does appear to be some pitting within the toenails.  Vascular: Dorsalis Pedis artery and Posterior Tibial artery pedal pulses are 2/4 bilateral with immedate capillary fill time.  There is no pain with calf compression, swelling, warmth, erythema.   Neruologic: Grossly intact via light touch bilateral.  Musculoskeletal: No gross boney pedal deformities bilateral. No pain, crepitus, or limitation noted with foot and ankle range of motion bilateral. Muscular strength 5/5 in all groups tested bilateral.  Gait: Unassisted, Nonantalgic.       Assessment:   On mycosis, onychodystrophy     Plan:  At this point we discussed different treatment for his toenails.  Do think he has fungus but some component of psoriasis as well.  We will start with a topical medication [facility] to help with nail fungus as well as diarrhea to help with  the damage.  If no improvement consider steroid cream.  Discussed oral treatment options but he wants to hold off on this.  As a courtesy I debrided the nails without any complications or bleeding x10  Vivi Barrack DPM

## 2020-10-23 ENCOUNTER — Ambulatory Visit: Payer: BC Managed Care – PPO | Admitting: Podiatry

## 2020-11-19 ENCOUNTER — Telehealth: Payer: Self-pay

## 2020-11-19 NOTE — Telephone Encounter (Signed)
Transition Care Management Unsuccessful Follow-up Telephone Call  Date of discharge and from where:  11/18/2020 from [facility]  Attempts:  1st Attempt  Reason for unsuccessful TCM follow-up call:  Left voice message

## 2020-11-20 NOTE — Telephone Encounter (Signed)
Transition Care Management Unsuccessful Follow-up Telephone Call  Date of discharge and from where:  11/18/20 from [facility]  Attempts:  2nd Attempt  Reason for unsuccessful TCM follow-up call:  Left voice message

## 2020-11-23 ENCOUNTER — Ambulatory Visit (INDEPENDENT_AMBULATORY_CARE_PROVIDER_SITE_OTHER): Payer: BC Managed Care – PPO | Admitting: Sports Medicine

## 2020-11-23 ENCOUNTER — Other Ambulatory Visit: Payer: Self-pay

## 2020-11-23 DIAGNOSIS — S01511D Laceration without foreign body of lip, subsequent encounter: Secondary | ICD-10-CM

## 2020-11-23 DIAGNOSIS — S01511A Laceration without foreign body of lip, initial encounter: Secondary | ICD-10-CM | POA: Insufficient documentation

## 2020-11-23 NOTE — Assessment & Plan Note (Signed)
Sutures removed, after 5 days. Up-to-date on Tdap. No change in plan, return as need.

## 2020-11-23 NOTE — Telephone Encounter (Signed)
Transition Care Management Unsuccessful Follow-up Telephone Call  Date of discharge and from where:  11/18/20 from [facility]  Attempts:  3rd Attempt  Reason for unsuccessful TCM follow-up call:  Left voice message

## 2020-11-23 NOTE — Progress Notes (Signed)
    Procedures performed today:    None.  Independent interpretation of notes and tests performed by another provider:   None.  Brief History, Exam, Impression, and Recommendations:    Laceration of lip Sutures removed, after 5 days. Up-to-date on Tdap. No change in plan, return as need.    ___________________________________________ Ihor Austin. Benjamin Stain, M.D., ABFM., CAQSM. Primary Care and Sports Medicine [facility]  Adjunct Instructor of Family Medicine  [employer]

## 2022-12-22 ENCOUNTER — Encounter: Payer: Self-pay | Admitting: Family Medicine

## 2022-12-22 ENCOUNTER — Telehealth: Payer: BC Managed Care – PPO | Admitting: Family Medicine

## 2022-12-22 VITALS — Temp 98.4°F | Ht 74.0 in | Wt 226.0 lb

## 2022-12-22 DIAGNOSIS — J029 Acute pharyngitis, unspecified: Secondary | ICD-10-CM | POA: Diagnosis not present

## 2022-12-22 MED ORDER — AMOXICILLIN 875 MG PO TABS
875.0000 mg | ORAL_TABLET | Freq: Two times a day (BID) | ORAL | 0 refills | Status: AC
Start: 2022-12-22 — End: 2023-01-01

## 2022-12-22 NOTE — Progress Notes (Signed)
    Virtual Visit via Video Note  I connected with Billy Campos on 12/22/22 at  8:30 AM EDT by a video enabled telemedicine application and verified that I am speaking with the correct person using two identifiers.   I discussed the limitations of evaluation and management by telemedicine and the availability of in person appointments. The patient expressed understanding and agreed to proceed.  Patient location: at home Provider location: in office  Subjective:    CC:   Chief Complaint  Patient presents with   Sore Throat   Fever   Cough    HPI: 5 days of ST, fever, and bodyaches. No sinus drianage or congestion. No cough. Painful to eat and swallow. He feels like throat is swollen. He has no fver this AM but has vomited.    Went ot [facility] on Tues, ( 2 days ago) and tested neg for COVID, Flu, and strep.     Past medical history, Surgical history, Family history not pertinant except as noted below, Social history, Allergies, and medications have been entered into the medical record, reviewed, and corrections made.    Objective:    General: Speaking clearly in complete sentences without any shortness of breath.  Alert and oriented x3.  Normal judgment. No apparent acute distress.  Voice is muffles.      Impression and Recommendations:    Problem List Items Addressed This Visit   None Visit Diagnoses     Pharyngitis, unspecified etiology    -  Primary   Relevant Medications   amoxicillin (AMOXIL) 875 MG tablet      Will tx with amox for strep throat. If not better by Monday let us know and will need in person appt and can test for Mono.  Try salt water gargles.   No orders of the defined types were placed in this encounter.   Meds ordered this encounter  Medications   amoxicillin (AMOXIL) 875 MG tablet    Sig: Take 1 tablet (875 mg total) by mouth 2 (two) times daily for 10 days.    Dispense:  14 tablet    Refill:  0     I discussed the assessment and  treatment plan with the patient. The patient was provided an opportunity to ask questions and all were answered. The patient agreed with the plan and demonstrated an understanding of the instructions.   The patient was advised to call back or seek an in-person evaluation if the symptoms worsen or if the condition fails to improve as anticipated.  I spent 15 minutes on the day of the encounter to include pre-visit record review, face-to-face time with the patient and post visit ordering of test.   Nani Gasser, MD

## 2022-12-22 NOTE — Progress Notes (Signed)
Pt reports that his sxs began4 days ago. He has tested for Covid 3x and all tests were negative. He was seen at UC 2 days ago for his sxs.   Pt reports taking Tyl and sudafed 7pm he stated that his throat pain has gotten worse since Sunday. He has only done warm water with honey but this hasn't helped  He was prescribed Mucinex however, he did not go pick this up.

## 2023-11-14 ENCOUNTER — Encounter: Payer: Self-pay | Admitting: Sports Medicine
